# Patient Record
Sex: Male | Born: 1995 | Race: White | Hispanic: No | Marital: Single | Smoking: Current every day smoker
Health system: Southern US, Community
[De-identification: ages and names within clinical notes are randomized; demographics above are authoritative.]

## PROBLEM LIST (undated history)

## (undated) DIAGNOSIS — J45909 Unspecified asthma, uncomplicated: Secondary | ICD-10-CM

---
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
## 2014-12-29 ENCOUNTER — Ambulatory Visit: Payer: Self-pay | Admitting: Unknown Physician Specialty

## 2014-12-30 ENCOUNTER — Encounter: Payer: Self-pay | Admitting: Unknown Physician Specialty

## 2014-12-30 ENCOUNTER — Ambulatory Visit (INDEPENDENT_AMBULATORY_CARE_PROVIDER_SITE_OTHER): Payer: 59 | Admitting: Unknown Physician Specialty

## 2014-12-30 VITALS — BP 129/79 | HR 79 | Temp 97.6°F | Ht 70.1 in | Wt 151.6 lb

## 2014-12-30 DIAGNOSIS — J0110 Acute frontal sinusitis, unspecified: Secondary | ICD-10-CM

## 2014-12-30 DIAGNOSIS — R059 Cough, unspecified: Secondary | ICD-10-CM

## 2014-12-30 DIAGNOSIS — J4520 Mild intermittent asthma, uncomplicated: Secondary | ICD-10-CM | POA: Insufficient documentation

## 2014-12-30 DIAGNOSIS — J4521 Mild intermittent asthma with (acute) exacerbation: Secondary | ICD-10-CM | POA: Diagnosis not present

## 2014-12-30 DIAGNOSIS — R05 Cough: Secondary | ICD-10-CM | POA: Diagnosis not present

## 2014-12-30 MED ORDER — BENZONATATE 200 MG PO CAPS: 200.0000 mg | ORAL_CAPSULE | Freq: Two times a day (BID) | ORAL | Status: DC | PRN

## 2014-12-30 MED ORDER — AZITHROMYCIN 250 MG PO TABS: ORAL_TABLET | ORAL | Status: DC

## 2014-12-30 MED ORDER — ALBUTEROL SULFATE HFA 108 (90 BASE) MCG/ACT IN AERS: 2.0000 | INHALATION_SPRAY | Freq: Four times a day (QID) | RESPIRATORY_TRACT | Status: DC | PRN

## 2014-12-30 NOTE — Progress Notes (Signed)
BP 129/79 mmHg  Pulse 79  Temp(Src) 97.6 F (36.4 C)  Ht 5' 10.1" (1.781 m)  Wt 151 lb 9.6 oz (68.765 kg)  BMI 21.68 kg/m2  SpO2 98%   Subjective:    Patient ID: Jeremiah Mccarthy, male    DOB: 06/21/1995, 19 y.o.   MRN: [id number]  HPI: Jeremiah Mccarthy is a 19 y.o. male  Chief Complaint  Patient presents with  . Cough    pt states he has a cough, chest pain from coughing so much, and sometimes a sore throat. Pt states cough started about a week and a half ago.    Cough This is a new problem. The current episode started 1 to 4 weeks ago. The problem has been unchanged. The problem occurs constantly. The cough is non-productive. Associated symptoms include a sore throat and wheezing. Pertinent negatives include no chest pain, ear congestion, ear pain, fever or nasal congestion. The symptoms are aggravated by lying down. Treatments tried: nebulizer treatments, Nyquil, and Mucinex. The treatment provided no relief. His past medical history is significant for asthma.    Relevant past medical, surgical, family and social history reviewed and updated as indicated. Interim medical history since our last visit reviewed. Allergies and medications reviewed and updated.  Review of Systems  Constitutional: Negative for fever.  HENT: Positive for sore throat. Negative for ear pain.   Respiratory: Positive for cough and wheezing.   Cardiovascular: Negative for chest pain.    Per HPI unless specifically indicated above     Objective:    BP 129/79 mmHg  Pulse 79  Temp(Src) 97.6 F (36.4 C)  Ht 5' 10.1" (1.781 m)  Wt 151 lb 9.6 oz (68.765 kg)  BMI 21.68 kg/m2  SpO2 98%  Wt Readings from Last 3 Encounters:  12/30/14 151 lb 9.6 oz (68.765 kg)  07/11/12 141 lb (63.957 kg) (34 %*, Z = -0.41)   * Growth percentiles are based on CDC 2-20 Years data.    Physical Exam  Constitutional: He is oriented to person, place, and time. He appears well-developed and well-nourished. No  distress.  HENT:  Head: Normocephalic and atraumatic.  Right Ear: Tympanic membrane and ear canal normal.  Left Ear: Tympanic membrane and ear canal normal.  Nose: Mucosal edema and sinus tenderness present. Right sinus exhibits maxillary sinus tenderness. Left sinus exhibits maxillary sinus tenderness.  Mouth/Throat: Mucous membranes are normal. Oropharyngeal exudate and posterior oropharyngeal edema present.  Eyes: Conjunctivae and lids are normal. Right eye exhibits no discharge. Left eye exhibits no discharge. No scleral icterus.  Cardiovascular: Normal rate and regular rhythm.   Pulmonary/Chest: Effort normal. No respiratory distress.  Abdominal: Normal appearance and bowel sounds are normal. He exhibits no distension. There is no splenomegaly or hepatomegaly. There is no tenderness.  Musculoskeletal: Normal range of motion.  Neurological: He is alert and oriented to person, place, and time.  Skin: Skin is intact. No rash noted. No pallor.  Psychiatric: He has a normal mood and affect. His behavior is normal. Judgment and thought content normal.    No results found for this or any previous visit.    Assessment & Plan:   Problem List Items Addressed This Visit      Unprioritized   Asthma, mild intermittent    rx for prn Albuterol       Relevant Medications   albuterol (PROVENTIL HFA;VENTOLIN HFA) 108 (90 BASE) MCG/ACT inhaler    Other Visit Diagnoses    Acute frontal sinusitis,  recurrence not specified    -  Primary    Rx for Z pacj    Relevant Medications    azithromycin (ZITHROMAX Z-PAK) 250 MG tablet    benzonatate (TESSALON) 200 MG capsule    Cough        rx for Tessalon perles        Follow up plan: Return if symptoms worsen or fail to improve.

## 2014-12-30 NOTE — Assessment & Plan Note (Signed)
rx for prn Albuterol

## 2019-10-17 ENCOUNTER — Ambulatory Visit: Payer: Self-pay

## 2019-10-20 ENCOUNTER — Other Ambulatory Visit: Payer: Self-pay

## 2019-10-20 ENCOUNTER — Ambulatory Visit
Admission: EM | Admit: 2019-10-20 | Discharge: 2019-10-20 | Disposition: A | Discharge status: Home / Self Care | Payer: BC Managed Care – PPO | Attending: Emergency Medicine | Admitting: Emergency Medicine

## 2019-10-20 DIAGNOSIS — Z20822 Contact with and (suspected) exposure to covid-19: Secondary | ICD-10-CM

## 2019-10-20 NOTE — ED Triage Notes (Signed)
Pt is here with a COVID exposure, pt is denying ALL sxs today. 

## 2019-10-23 LAB — SARS-COV-2, NAA 2 DAY TAT

## 2019-10-23 LAB — NOVEL CORONAVIRUS, NAA: SARS-CoV-2, NAA: NOT DETECTED

## 2020-02-20 ENCOUNTER — Encounter: Payer: Self-pay | Admitting: Emergency Medicine

## 2020-02-20 ENCOUNTER — Other Ambulatory Visit: Payer: Self-pay

## 2020-02-20 DIAGNOSIS — M545 Low back pain, unspecified: Secondary | ICD-10-CM | POA: Insufficient documentation

## 2020-02-20 DIAGNOSIS — J4520 Mild intermittent asthma, uncomplicated: Secondary | ICD-10-CM | POA: Diagnosis not present

## 2020-02-20 DIAGNOSIS — Y92410 Unspecified street and highway as the place of occurrence of the external cause: Secondary | ICD-10-CM | POA: Diagnosis not present
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
## 2020-02-21 ENCOUNTER — Emergency Department
Admission: EM | Admit: 2020-02-21 | Discharge: 2020-02-21 | Disposition: A | Discharge status: Home / Self Care | Payer: BC Managed Care – PPO | Attending: Emergency Medicine | Admitting: Emergency Medicine

## 2020-02-21 DIAGNOSIS — M7918 Myalgia, other site: Secondary | ICD-10-CM

## 2020-02-21 NOTE — Discharge Instructions (Signed)
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
## 2020-03-28 ENCOUNTER — Emergency Department: Payer: No Typology Code available for payment source

## 2020-03-28 ENCOUNTER — Other Ambulatory Visit: Payer: Self-pay

## 2020-03-28 ENCOUNTER — Emergency Department
Admission: EM | Admit: 2020-03-28 | Discharge: 2020-03-28 | Disposition: A | Discharge status: Home / Self Care | Payer: No Typology Code available for payment source | Attending: Emergency Medicine | Admitting: Emergency Medicine

## 2020-03-28 DIAGNOSIS — S62322A Displaced fracture of shaft of third metacarpal bone, right hand, initial encounter for closed fracture: Secondary | ICD-10-CM | POA: Insufficient documentation

## 2020-03-28 DIAGNOSIS — Y92410 Unspecified street and highway as the place of occurrence of the external cause: Secondary | ICD-10-CM | POA: Insufficient documentation

## 2020-03-28 DIAGNOSIS — M545 Low back pain, unspecified: Secondary | ICD-10-CM | POA: Diagnosis not present

## 2020-03-28 DIAGNOSIS — S6991XA Unspecified injury of right wrist, hand and finger(s), initial encounter: Secondary | ICD-10-CM | POA: Diagnosis present

## 2020-03-28 DIAGNOSIS — J4520 Mild intermittent asthma, uncomplicated: Secondary | ICD-10-CM | POA: Diagnosis not present

## 2020-03-28 DIAGNOSIS — T07XXXA Unspecified multiple injuries, initial encounter: Secondary | ICD-10-CM

## 2020-03-28 DIAGNOSIS — F17200 Nicotine dependence, unspecified, uncomplicated: Secondary | ICD-10-CM | POA: Diagnosis not present

## 2020-03-28 DIAGNOSIS — S62324A Displaced fracture of shaft of fourth metacarpal bone, right hand, initial encounter for closed fracture: Secondary | ICD-10-CM | POA: Diagnosis not present

## 2020-03-28 MED ORDER — OXYCODONE-ACETAMINOPHEN 5-325 MG PO TABS
2.0000 | ORAL_TABLET | Freq: Once | ORAL | Status: AC
  Administered 2020-03-28: 2 via ORAL
  Filled 2020-03-28: qty 2

## 2020-03-28 MED ORDER — ONDANSETRON 4 MG PO TBDP
4.0000 mg | ORAL_TABLET | Freq: Once | ORAL | Status: AC
  Administered 2020-03-28: 4 mg via ORAL
  Filled 2020-03-28: qty 1

## 2020-03-28 MED ORDER — BACITRACIN ZINC 500 UNIT/GM EX OINT: TOPICAL_OINTMENT | Freq: Two times a day (BID) | CUTANEOUS | Status: DC

## 2020-03-28 MED ORDER — OXYCODONE-ACETAMINOPHEN 5-325 MG PO TABS: 2.0000 | ORAL_TABLET | Freq: Four times a day (QID) | ORAL | 0 refills | Status: DC | PRN

## 2020-03-28 MED ORDER — DOCUSATE SODIUM 100 MG PO CAPS: ORAL_CAPSULE | ORAL | 0 refills | Status: DC
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
## 2020-03-28 NOTE — ED Notes (Signed)
Patient transported to X-ray 
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
## 2020-03-29 ENCOUNTER — Other Ambulatory Visit: Payer: Self-pay | Admitting: Orthopedic Surgery

## 2020-03-30 ENCOUNTER — Other Ambulatory Visit
Admission: RE | Admit: 2020-03-30 | Discharge: 2020-03-30 | Disposition: A | Discharge status: Home / Self Care | Payer: HRSA Program | Source: Ambulatory Visit | Attending: Orthopedic Surgery | Admitting: Orthopedic Surgery

## 2020-03-30 ENCOUNTER — Other Ambulatory Visit: Payer: Self-pay

## 2020-03-30 DIAGNOSIS — Z01812 Encounter for preprocedural laboratory examination: Secondary | ICD-10-CM | POA: Diagnosis present

## 2020-03-30 DIAGNOSIS — Z20822 Contact with and (suspected) exposure to covid-19: Secondary | ICD-10-CM | POA: Diagnosis not present

## 2020-03-30 LAB — SARS CORONAVIRUS 2 (TAT 6-24 HRS): SARS Coronavirus 2: NEGATIVE

## 2020-03-31 ENCOUNTER — Other Ambulatory Visit
Admission: RE | Admit: 2020-03-31 | Discharge: 2020-03-31 | Disposition: A | Discharge status: Home / Self Care | Payer: Self-pay | Source: Ambulatory Visit | Attending: Orthopedic Surgery | Admitting: Orthopedic Surgery

## 2020-03-31 ENCOUNTER — Other Ambulatory Visit: Payer: Self-pay

## 2020-03-31 HISTORY — DX: Unspecified asthma, uncomplicated: J45.909

## 2020-03-31 NOTE — Patient Instructions (Signed)
Your procedure is scheduled on: Thursday April 01, 2020. Report to Day Surgery inside [facility] 2nd floor (stop by Admissions desk before getting on the Elevator). To find out your arrival time please call [phone] between 1PM - 3PM on Wednesday March 31, 2020.  Remember: Instructions that are not followed completely may result in serious medical risk,  up to and including death, or upon the discretion of your surgeon and anesthesiologist your  surgery may need to be rescheduled.     _X__ 1. Do not eat food after midnight the night before your procedure.                 No chewing gum or hard candies. You may drink clear liquids up to 2 hours                 before you are scheduled to arrive for your surgery- DO not drink clear                 liquids within 2 hours of the start of your surgery.                 Clear Liquids include:  water, apple juice without pulp, clear Gatorade, G2 or                  Gatorade Zero (avoid Red/Purple/Blue), Black Coffee or Tea (Do not add                 anything to coffee or tea).  __X__2.  On the morning of surgery brush your teeth with toothpaste and water, you                may rinse your mouth with mouthwash if you wish.  Do not swallow any toothpaste of mouthwash.     _X__ 3.  No Alcohol for 24 hours before or after surgery.   _X__ 4.  Do Not Smoke or use e-cigarettes For 24 Hours Prior to Your Surgery.                 Do not use any chewable tobacco products for at least 6 hours prior to                 Surgery.  _X__  5.  Do not use any recreational drugs (marijuana, cocaine, heroin, ecstasy, MDMA or other)                For at least one week prior to your surgery.  Combination of these drugs with anesthesia                May have life threatening results.  __X__ 6.  Notify your doctor if there is any change in your medical condition      (cold, fever, infections).     Do not wear jewelry, make-up,  hairpins, clips or nail polish. Do not wear lotions, powders, or perfumes. You may wear deodorant. Do not shave 48 hours prior to surgery. Men may shave face and neck. Do not bring valuables to the hospital.    [facility] is not responsible for any belongings or valuables.  Contacts, dentures or bridgework may not be worn into surgery. Leave your suitcase in the car. After surgery it may be brought to your room. For patients admitted to the hospital, discharge time is determined by your treatment team.   Patients discharged the day of surgery will not be allowed to drive  home.   Make arrangements for someone to be with you for the first 24 hours of your Same Day Discharge.   __X__ Take these medicines the morning of surgery with A SIP OF WATER:    1. oxyCODONE-acetaminophen (PERCOCET) 5-325      ____ Fleet Enema (as directed)   __X__ Use Antibacterial Soap as directed  ____ Use Benzoyl Peroxide Gel as instructed  ____ Use inhalers on the day of surgery  ____ Stop metformin 2 days prior to surgery    __x__ Stop Anti-inflammatories such as Ibuprofen, Aleve, Advil, naproxen, aspirin and or BC powders.   __x__ Stop supplements until after surgery.    __x__ Do not start any herbal supplements before your procedure.    If you have any questions regarding your pre-procedure instructions,  Please call Pre-admit Testing at [phone].

## 2020-04-01 ENCOUNTER — Ambulatory Visit
Admission: RE | Admit: 2020-04-01 | Discharge: 2020-04-01 | Disposition: A | Discharge status: Home / Self Care | Payer: Self-pay | Attending: Orthopedic Surgery | Admitting: Orthopedic Surgery

## 2020-04-01 ENCOUNTER — Other Ambulatory Visit: Payer: Self-pay

## 2020-04-01 ENCOUNTER — Ambulatory Visit: Payer: Self-pay

## 2020-04-01 ENCOUNTER — Encounter: Payer: Self-pay | Admitting: Orthopedic Surgery

## 2020-04-01 ENCOUNTER — Ambulatory Visit: Payer: Self-pay | Admitting: Certified Registered"

## 2020-04-01 ENCOUNTER — Encounter
Admission: RE | Disposition: A | Discharge status: Home / Self Care | Payer: Self-pay | Source: Home / Self Care | Attending: Orthopedic Surgery

## 2020-04-01 DIAGNOSIS — Z8781 Personal history of (healed) traumatic fracture: Secondary | ICD-10-CM

## 2020-04-01 DIAGNOSIS — S62325A Displaced fracture of shaft of fourth metacarpal bone, left hand, initial encounter for closed fracture: Secondary | ICD-10-CM | POA: Diagnosis present

## 2020-04-01 DIAGNOSIS — F17200 Nicotine dependence, unspecified, uncomplicated: Secondary | ICD-10-CM | POA: Diagnosis not present

## 2020-04-01 DIAGNOSIS — S62322A Displaced fracture of shaft of third metacarpal bone, right hand, initial encounter for closed fracture: Secondary | ICD-10-CM | POA: Insufficient documentation

## 2020-04-01 HISTORY — PX: OPEN REDUCTION INTERNAL FIXATION (ORIF) METACARPAL: SHX6234

## 2020-04-01 SURGERY — OPEN REDUCTION INTERNAL FIXATION (ORIF) METACARPAL
Anesthesia: General | Site: Finger | Laterality: Bilateral

## 2020-04-01 MED ORDER — MIDAZOLAM HCL 2 MG/2ML IJ SOLN
INTRAMUSCULAR | Status: AC
  Filled 2020-04-01: qty 2

## 2020-04-01 MED ORDER — METOCLOPRAMIDE HCL 10 MG PO TABS: 5.0000 mg | ORAL_TABLET | Freq: Three times a day (TID) | ORAL | Status: DC | PRN

## 2020-04-01 MED ORDER — ONDANSETRON HCL 4 MG/2ML IJ SOLN
INTRAMUSCULAR | Status: AC
  Filled 2020-04-01: qty 2

## 2020-04-01 MED ORDER — FENTANYL CITRATE (PF) 100 MCG/2ML IJ SOLN
INTRAMUSCULAR | Status: AC
  Filled 2020-04-01: qty 2

## 2020-04-01 MED ORDER — ACETAMINOPHEN 10 MG/ML IV SOLN
INTRAVENOUS | Status: AC
  Filled 2020-04-01: qty 100

## 2020-04-01 MED ORDER — ONDANSETRON HCL 4 MG PO TABS: 4.0000 mg | ORAL_TABLET | Freq: Four times a day (QID) | ORAL | Status: DC | PRN

## 2020-04-01 MED ORDER — ACETAMINOPHEN 10 MG/ML IV SOLN
INTRAVENOUS | Status: DC | PRN
  Administered 2020-04-01: 1000 mg via INTRAVENOUS

## 2020-04-01 MED ORDER — DEXAMETHASONE SODIUM PHOSPHATE 10 MG/ML IJ SOLN
INTRAMUSCULAR | Status: DC | PRN
  Administered 2020-04-01: 10 mg via INTRAVENOUS

## 2020-04-01 MED ORDER — LACTATED RINGERS IV SOLN: INTRAVENOUS | Status: DC

## 2020-04-01 MED ORDER — FENTANYL CITRATE (PF) 100 MCG/2ML IJ SOLN
25.0000 ug | INTRAMUSCULAR | Status: DC | PRN
Start: 2020-04-01 — End: 2020-04-01

## 2020-04-01 MED ORDER — DEXAMETHASONE SODIUM PHOSPHATE 10 MG/ML IJ SOLN
INTRAMUSCULAR | Status: AC
  Filled 2020-04-01: qty 1

## 2020-04-01 MED ORDER — GLYCOPYRROLATE 0.2 MG/ML IJ SOLN
INTRAMUSCULAR | Status: DC | PRN
  Administered 2020-04-01: .2 mg via INTRAVENOUS

## 2020-04-01 MED ORDER — PROPOFOL 10 MG/ML IV BOLUS
INTRAVENOUS | Status: DC | PRN
  Administered 2020-04-01: 180 mg via INTRAVENOUS

## 2020-04-01 MED ORDER — METOCLOPRAMIDE HCL 5 MG/ML IJ SOLN: 5.0000 mg | Freq: Three times a day (TID) | INTRAMUSCULAR | Status: DC | PRN

## 2020-04-01 MED ORDER — DEXMEDETOMIDINE (PRECEDEX) IN NS 20 MCG/5ML (4 MCG/ML) IV SYRINGE
PREFILLED_SYRINGE | INTRAVENOUS | Status: DC | PRN
  Administered 2020-04-01: 8 ug via INTRAVENOUS
  Administered 2020-04-01: 12 ug via INTRAVENOUS

## 2020-04-01 MED ORDER — LIDOCAINE HCL (CARDIAC) PF 100 MG/5ML IV SOSY
PREFILLED_SYRINGE | INTRAVENOUS | Status: DC | PRN
  Administered 2020-04-01: 60 mg via INTRAVENOUS

## 2020-04-01 MED ORDER — FAMOTIDINE 20 MG PO TABS
ORAL_TABLET | ORAL | Status: AC
  Administered 2020-04-01: 20 mg via ORAL
  Filled 2020-04-01: qty 1

## 2020-04-01 MED ORDER — CHLORHEXIDINE GLUCONATE 0.12 % MT SOLN: 15.0000 mL | Freq: Once | OROMUCOSAL | Status: AC

## 2020-04-01 MED ORDER — CHLORHEXIDINE GLUCONATE 0.12 % MT SOLN
OROMUCOSAL | Status: AC
  Administered 2020-04-01: 15 mL via OROMUCOSAL
  Filled 2020-04-01: qty 15

## 2020-04-01 MED ORDER — EPHEDRINE 5 MG/ML INJ
INTRAVENOUS | Status: AC
  Filled 2020-04-01: qty 10

## 2020-04-01 MED ORDER — KETOROLAC TROMETHAMINE 30 MG/ML IJ SOLN
INTRAMUSCULAR | Status: DC | PRN
  Administered 2020-04-01: 30 mg via INTRAVENOUS

## 2020-04-01 MED ORDER — FAMOTIDINE 20 MG PO TABS: 20.0000 mg | ORAL_TABLET | Freq: Once | ORAL | Status: AC

## 2020-04-01 MED ORDER — ONDANSETRON HCL 4 MG/2ML IJ SOLN
INTRAMUSCULAR | Status: DC | PRN
  Administered 2020-04-01: 4 mg via INTRAVENOUS

## 2020-04-01 MED ORDER — ONDANSETRON HCL 4 MG/2ML IJ SOLN: 4.0000 mg | Freq: Four times a day (QID) | INTRAMUSCULAR | Status: DC | PRN

## 2020-04-01 MED ORDER — ONDANSETRON HCL 4 MG/2ML IJ SOLN: 4.0000 mg | Freq: Once | INTRAMUSCULAR | Status: DC | PRN

## 2020-04-01 MED ORDER — PHENYLEPHRINE HCL (PRESSORS) 10 MG/ML IV SOLN
INTRAVENOUS | Status: DC | PRN
  Administered 2020-04-01: 100 ug via INTRAVENOUS
  Administered 2020-04-01: 50 ug via INTRAVENOUS

## 2020-04-01 MED ORDER — FENTANYL CITRATE (PF) 100 MCG/2ML IJ SOLN
INTRAMUSCULAR | Status: DC | PRN
  Administered 2020-04-01: 100 ug via INTRAVENOUS
  Administered 2020-04-01: 25 ug via INTRAVENOUS
  Administered 2020-04-01: 50 ug via INTRAVENOUS
  Administered 2020-04-01: 25 ug via INTRAVENOUS

## 2020-04-01 MED ORDER — CEFAZOLIN SODIUM-DEXTROSE 2-4 GM/100ML-% IV SOLN
2.0000 g | INTRAVENOUS | Status: AC
  Administered 2020-04-01: 2 g via INTRAVENOUS

## 2020-04-01 MED ORDER — SODIUM CHLORIDE 0.9 % IV SOLN: INTRAVENOUS | Status: DC

## 2020-04-01 MED ORDER — CEFAZOLIN SODIUM-DEXTROSE 2-4 GM/100ML-% IV SOLN
INTRAVENOUS | Status: AC
  Filled 2020-04-01: qty 100

## 2020-04-01 MED ORDER — EPHEDRINE SULFATE 50 MG/ML IJ SOLN
INTRAMUSCULAR | Status: DC | PRN
  Administered 2020-04-01: 5 mg via INTRAVENOUS

## 2020-04-01 MED ORDER — LIDOCAINE HCL (PF) 2 % IJ SOLN
INTRAMUSCULAR | Status: AC
  Filled 2020-04-01: qty 5

## 2020-04-01 MED ORDER — ORAL CARE MOUTH RINSE: 15.0000 mL | Freq: Once | OROMUCOSAL | Status: AC

## 2020-04-01 MED ORDER — MIDAZOLAM HCL 2 MG/2ML IJ SOLN
INTRAMUSCULAR | Status: DC | PRN
  Administered 2020-04-01: 2 mg via INTRAVENOUS

## 2020-04-01 MED ORDER — PROPOFOL 10 MG/ML IV BOLUS
INTRAVENOUS | Status: AC
  Filled 2020-04-01: qty 40

## 2020-04-01 SURGICAL SUPPLY — 50 items
APL PRP STRL LF DISP 70% ISPRP (MISCELLANEOUS) ×1
BIT DRILL 1.1X60MM (BIT) ×1 IMPLANT
BNDG CMPR STD VLCR NS LF 5.8X4 (GAUZE/BANDAGES/DRESSINGS) ×1
BNDG ELASTIC 4X5.8 VLCR NS LF (GAUZE/BANDAGES/DRESSINGS) ×2 IMPLANT
BNDG GAUZE 1X2.1 STRL (MISCELLANEOUS) ×2 IMPLANT
CHLORAPREP W/TINT 26 (MISCELLANEOUS) ×2 IMPLANT
COVER WAND RF STERILE (DRAPES) ×2 IMPLANT
CUFF TOURN SGL QUICK 12 (TOURNIQUET CUFF) ×2 IMPLANT
CUFF TOURN SGL QUICK 18X4 (TOURNIQUET CUFF) IMPLANT
DRAPE FLUOR MINI C-ARM 54X84 (DRAPES) ×2 IMPLANT
DRILL 1.1X60MM (BIT) ×2
DRILL BIT 2.5MM MINI-QUICK CON (BIT) ×2 IMPLANT
DRIVER BIT 1.5 (TRAUMA) ×2 IMPLANT
ELECT CAUTERY BLADE 6.4 (BLADE) ×2 IMPLANT
ELECT REM PT RETURN 9FT ADLT (ELECTROSURGICAL) ×2
ELECTRODE REM PT RTRN 9FT ADLT (ELECTROSURGICAL) ×1 IMPLANT
GAUZE SPONGE 4X4 12PLY STRL (GAUZE/BANDAGES/DRESSINGS) ×2 IMPLANT
GAUZE XEROFORM 1X8 LF (GAUZE/BANDAGES/DRESSINGS) ×2 IMPLANT
GLOVE SURG SYN 9.0  PF PI (GLOVE) ×1
GLOVE SURG SYN 9.0 PF PI (GLOVE) ×1 IMPLANT
GOWN SRG 2XL LVL 4 RGLN SLV (GOWNS) ×1 IMPLANT
GOWN STRL NON-REIN 2XL LVL4 (GOWNS) ×2
GOWN STRL REUS W/ TWL LRG LVL3 (GOWN DISPOSABLE) ×1 IMPLANT
GOWN STRL REUS W/TWL LRG LVL3 (GOWN DISPOSABLE) ×2
K-WIRE .045X6 DBL TRO NS (WIRE) ×2
K-WIRE DBL TRONS .035X6 (WIRE) ×2
KIT TURNOVER KIT A (KITS) ×2 IMPLANT
KWIRE .045X6 DBL TRO NS (WIRE) ×1 IMPLANT
KWIRE DBL TRONS .035X6 (WIRE) ×1 IMPLANT
MANIFOLD NEPTUNE II (INSTRUMENTS) ×2 IMPLANT
NEEDLE FILTER BLUNT 18X 1/2SAF (NEEDLE) ×1
NEEDLE FILTER BLUNT 18X1 1/2 (NEEDLE) ×1 IMPLANT
NS IRRIG 500ML POUR BTL (IV SOLUTION) ×2 IMPLANT
PACK EXTREMITY ARMC (MISCELLANEOUS) ×2 IMPLANT
PAD PREP 24X41 OB/GYN DISP (PERSONAL CARE ITEMS) ×2 IMPLANT
PADDING CAST BLEND 4X4 NS (MISCELLANEOUS) ×2 IMPLANT
PLATE STRAIGHT LOCK 1.5 (Plate) ×4 IMPLANT
SCALPEL PROTECTED #15 DISP (BLADE) ×4 IMPLANT
SCREW 1.5X8 (Screw) ×2 IMPLANT
SCREW CORTICAL 1.5X9MM WRIST (Screw) ×2 IMPLANT
SCREW L 1.5X14 (Screw) ×2 IMPLANT
SCREW LOCKING 1.5X13MM (Screw) ×2 IMPLANT
SCREW NL 1.5X11 WRIST (Screw) ×6 IMPLANT
SCREW NL 1.5X12 (Screw) ×10 IMPLANT
SCREW NL 1.5X13 (Screw) ×2 IMPLANT
SCREW NONIOC 1.5 10M (Screw) ×2 IMPLANT
SCREW NONIOC 1.5 14M (Screw) ×2 IMPLANT
SCREW NONIOC 1.5 16M (Screw) ×2 IMPLANT
SUT ETHIBOND 4-0 (SUTURE) ×2 IMPLANT
SUT ETHILON 5 0 CL P 3 (SUTURE) ×2 IMPLANT

## 2020-04-01 NOTE — Discharge Instructions (Addendum)
AMBULATORY SURGERY  DISCHARGE INSTRUCTIONS   1) The drugs that you were given will stay in your system until tomorrow so for the next 24 hours you should not:  A) Drive an automobile B) Make any legal decisions C) Drink any alcoholic beverage   2) You may resume regular meals tomorrow.  Today it is better to start with liquids and gradually work up to solid foods.  You may eat anything you prefer, but it is better to start with liquids, then soup and crackers, and gradually work up to solid foods.   3) Please notify your doctor immediately if you have any unusual bleeding, trouble breathing, redness and pain at the surgery site, drainage, fever, or pain not relieved by medication.    4) Additional Instructions:        Please contact your physician with any problems or Same Day Surgery at [phone], Monday through Friday 6 am to 4 pm, or [place] [facility] number at [phone].    Keep arm elevated is much as you can with ice to the back of the hand today and tomorrow. Pain medicine as directed. Wiggle fingers in splint but do not try to straighten wrist or fingers. Call office if having problems No driving for 6 weeks

## 2020-04-01 NOTE — Anesthesia Postprocedure Evaluation (Signed)
Anesthesia Post Note  Patient: Jeremiah Mccarthy  Procedure(s) Performed: ORIF 3rd metacarpal, right ORIF 4th metacarpal, left (Bilateral Finger)  Patient location during evaluation: PACU Anesthesia Type: General Level of consciousness: awake and alert Pain management: pain level controlled Vital Signs Assessment: post-procedure vital signs reviewed and stable Respiratory status: spontaneous breathing, nonlabored ventilation, respiratory function stable and patient connected to nasal cannula oxygen Cardiovascular status: blood pressure returned to baseline and stable Postop Assessment: no apparent nausea or vomiting Anesthetic complications: no   No complications documented.   Last Vitals:  Vitals:   04/01/20 1742 04/01/20 1745  BP:  106/72  Pulse: (!) 54 (!) 54  Resp: 11 17  Temp: (!) 36.1 C (!) 36.1 C  SpO2: 98% 100%    Last Pain:  Vitals:   04/01/20 1745  TempSrc: Temporal  PainSc: 0-No pain                 Cleda Mccreedy Lindsey Hommel

## 2020-04-01 NOTE — Anesthesia Preprocedure Evaluation (Signed)
Anesthesia Evaluation  Patient identified by MRN, date of birth, ID band Patient awake    Reviewed: Allergy & Precautions, NPO status , Patient's Chart, lab work & pertinent test results  Airway Mallampati: II  TM Distance: >3 FB     Dental  (+) Teeth Intact   Pulmonary asthma , Current Smoker,    Pulmonary exam normal        Cardiovascular negative cardio ROS Normal cardiovascular exam     Neuro/Psych negative neurological ROS  negative psych ROS   GI/Hepatic negative GI ROS, Neg liver ROS,   Endo/Other  negative endocrine ROS  Renal/GU negative Renal ROS  negative genitourinary   Musculoskeletal   Abdominal Normal abdominal exam  (+)   Peds negative pediatric ROS (+)  Hematology negative hematology ROS (+)   Anesthesia Other Findings Past Medical History: No date: Asthma  Reproductive/Obstetrics                             Anesthesia Physical Anesthesia Plan  ASA: II  Anesthesia Plan: General   Post-op Pain Management:    Induction: Intravenous  PONV Risk Score and Plan:   Airway Management Planned: LMA  Additional Equipment:   Intra-op Plan:   Post-operative Plan: Extubation in OR  Informed Consent: I have reviewed the patients History and Physical, chart, labs and discussed the procedure including the risks, benefits and alternatives for the proposed anesthesia with the patient or authorized representative who has indicated his/her understanding and acceptance.     Dental advisory given  Plan Discussed with: CRNA and Surgeon  Anesthesia Plan Comments:         Anesthesia Quick Evaluation

## 2020-04-01 NOTE — Anesthesia Procedure Notes (Signed)
Procedure Name: LMA Insertion Date/Time: 04/01/2020 2:46 PM Performed by: Jaye Beagle, CRNA Pre-anesthesia Checklist: Patient identified, Emergency Drugs available, Suction available and Patient being monitored Patient Re-evaluated:Patient Re-evaluated prior to induction Oxygen Delivery Method: Circle system utilized Preoxygenation: Pre-oxygenation with 100% oxygen Induction Type: IV induction Ventilation: Mask ventilation without difficulty LMA: LMA inserted LMA Size: 4.0 Number of attempts: 1 Tube secured with: Tape Dental Injury: Teeth and Oropharynx as per pre-operative assessment
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
## 2020-04-01 NOTE — Op Note (Signed)
04/01/2020  4:51 PM  PATIENT:  Jeremiah Mccarthy  25 y.o. male  PRE-OPERATIVE DIAGNOSIS:  Closed displaced fracture of shaft of third metacarpal bone of right hand, initial encounter S62.322A Closed displaced fracture of shaft of fourth metacarpal bone of left hand, initial encounter S62.325A  POST-OPERATIVE DIAGNOSIS:  Closed displaced fracture of shaft of third metacarpal bone of right hand, initial encounter S62.322A Closed displaced fracture of shaft of fourth metacarpal bone of left hand, initial encounter S62.325A  PROCEDURE:  Procedure(s): ORIF 3rd metacarpal, right ORIF 4th metacarpal, left (Bilateral)  SURGEON: Leitha Schuller, MD  ASSISTANTS: None  ANESTHESIA:   general  EBL:  Total I/O In: 1000 [I.V.:1000] Out: 3 [Blood:3]  BLOOD ADMINISTERED:none  DRAINS: none   LOCAL MEDICATIONS USED:  MARCAINE     SPECIMEN:  No Specimen  DISPOSITION OF SPECIMEN:  N/A  COUNTS:  YES  TOURNIQUET:   Total Tourniquet Time Documented: Upper Arm (Right) - 91 minutes Total: Upper Arm (Right) - 91 minutes   IMPLANTS: [employer] hand system with 1.5 mm plates and screws  DICTATION: .Dragon Dictation patient was brought to the operating room and after adequate general anesthesia was obtained the right arm was prepped and draped you sterile fashion with tourniquet applied the upper arm.  After patient identification and timeout procedure tourniquet was raised and incision made between the third and fourth metacarpals with the juncturae split and the metacarpal fractures easily identified as they had separated from the adjacent tissue.  The third metacarpal was addressed first getting exposure and getting traction applied and a K wire to hold it provisionally 2 screws were placed from the side to try to get compression at the split it was going up into the head and then a plate was applied dorsally with multiple screws holding the fracture in anatomic alignment based on mini C arm  views.  Rotation appeared correct on hand flexion.  Went after this was complete with acceptable alignment the fourth metacarpal was exposed in a similar fashion and a shorter plate applied as it was partially reduced by fixing the third which had more significant injury.  After the 6-hole plate was applied to the fourth metacarpal multiple screws were placed with good fixation and stable to rotation flexion and extension rotation appeared stable the results some ulnar drift of the third MCP joint.  The wound was irrigated and tourniquet let down after dressing applied with 3-0 Vicryl used subcutaneously and 4-0 nylon for the skin in a running subcuticular closure 10 cc of half percent Sensorcaine were infiltrated as local followed by Xeroform 4 x 4's web roll and a dorsal block splint keeping the MCPs in flexion.  PLAN OF CARE: Discharge to home after PACU  PATIENT DISPOSITION:  PACU - hemodynamically stable.

## 2020-04-01 NOTE — Transfer of Care (Signed)
Immediate Anesthesia Transfer of Care Note  Patient: Jeremiah Mccarthy  Procedure(s) Performed: ORIF 3rd metacarpal, right ORIF 4th metacarpal, left (Bilateral Finger)  Patient Location: PACU  Anesthesia Type:General  Level of Consciousness: drowsy  Airway & Oxygen Therapy: Patient Spontanous Breathing and Patient connected to face mask oxygen  Post-op Assessment: Report given to RN  Post vital signs: stable  Last Vitals:  Vitals Value Taken Time  BP    Temp    Pulse 56 04/01/20 1645  Resp    SpO2 100 % 04/01/20 1645  Vitals shown include unvalidated device data.  Last Pain:  Vitals:   04/01/20 1313  TempSrc: Oral  PainSc: 0-No pain         Complications: No complications documented.

## 2020-04-02 ENCOUNTER — Encounter: Payer: Self-pay | Admitting: Orthopedic Surgery

## 2021-04-21 ENCOUNTER — Ambulatory Visit: Payer: Self-pay | Admitting: Internal Medicine

## 2022-10-23 IMAGING — DX DG HAND 2V*R*
2 series · 2 of 2 positions shown · non-contrast
Comparison: 03/28/2020

CLINICAL DATA: ORIF hand fracture

EXAM:
RIGHT HAND - 2 VIEW

[hand ap]
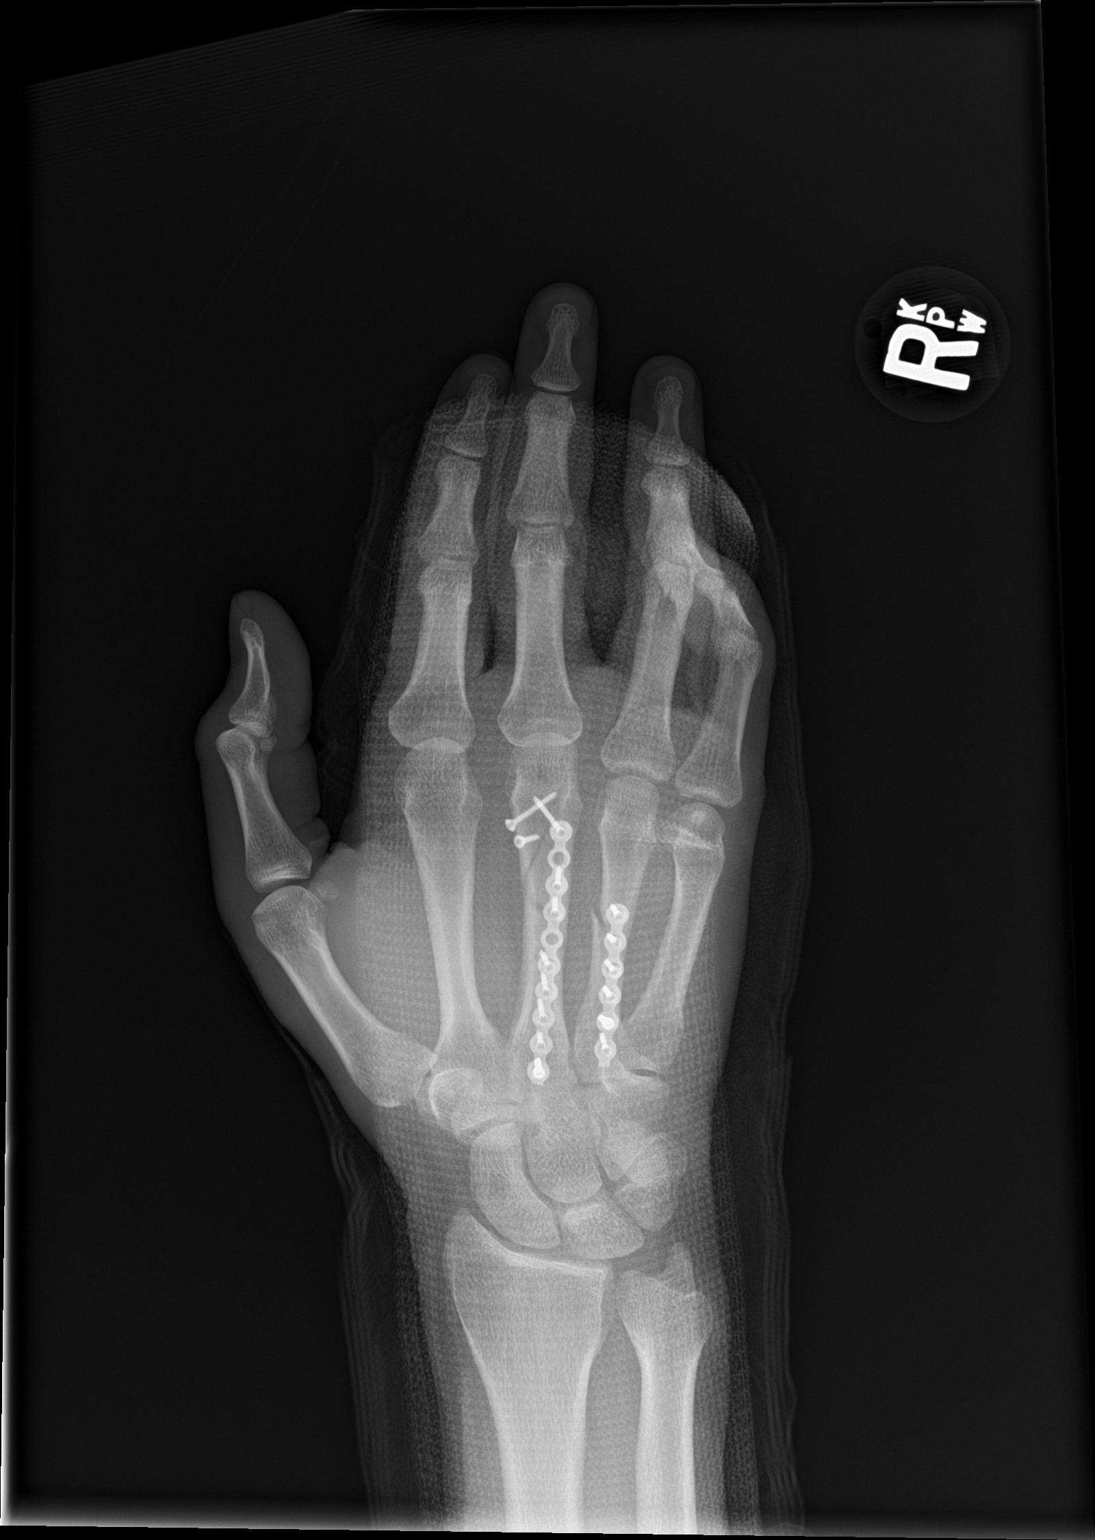

[hand lat]
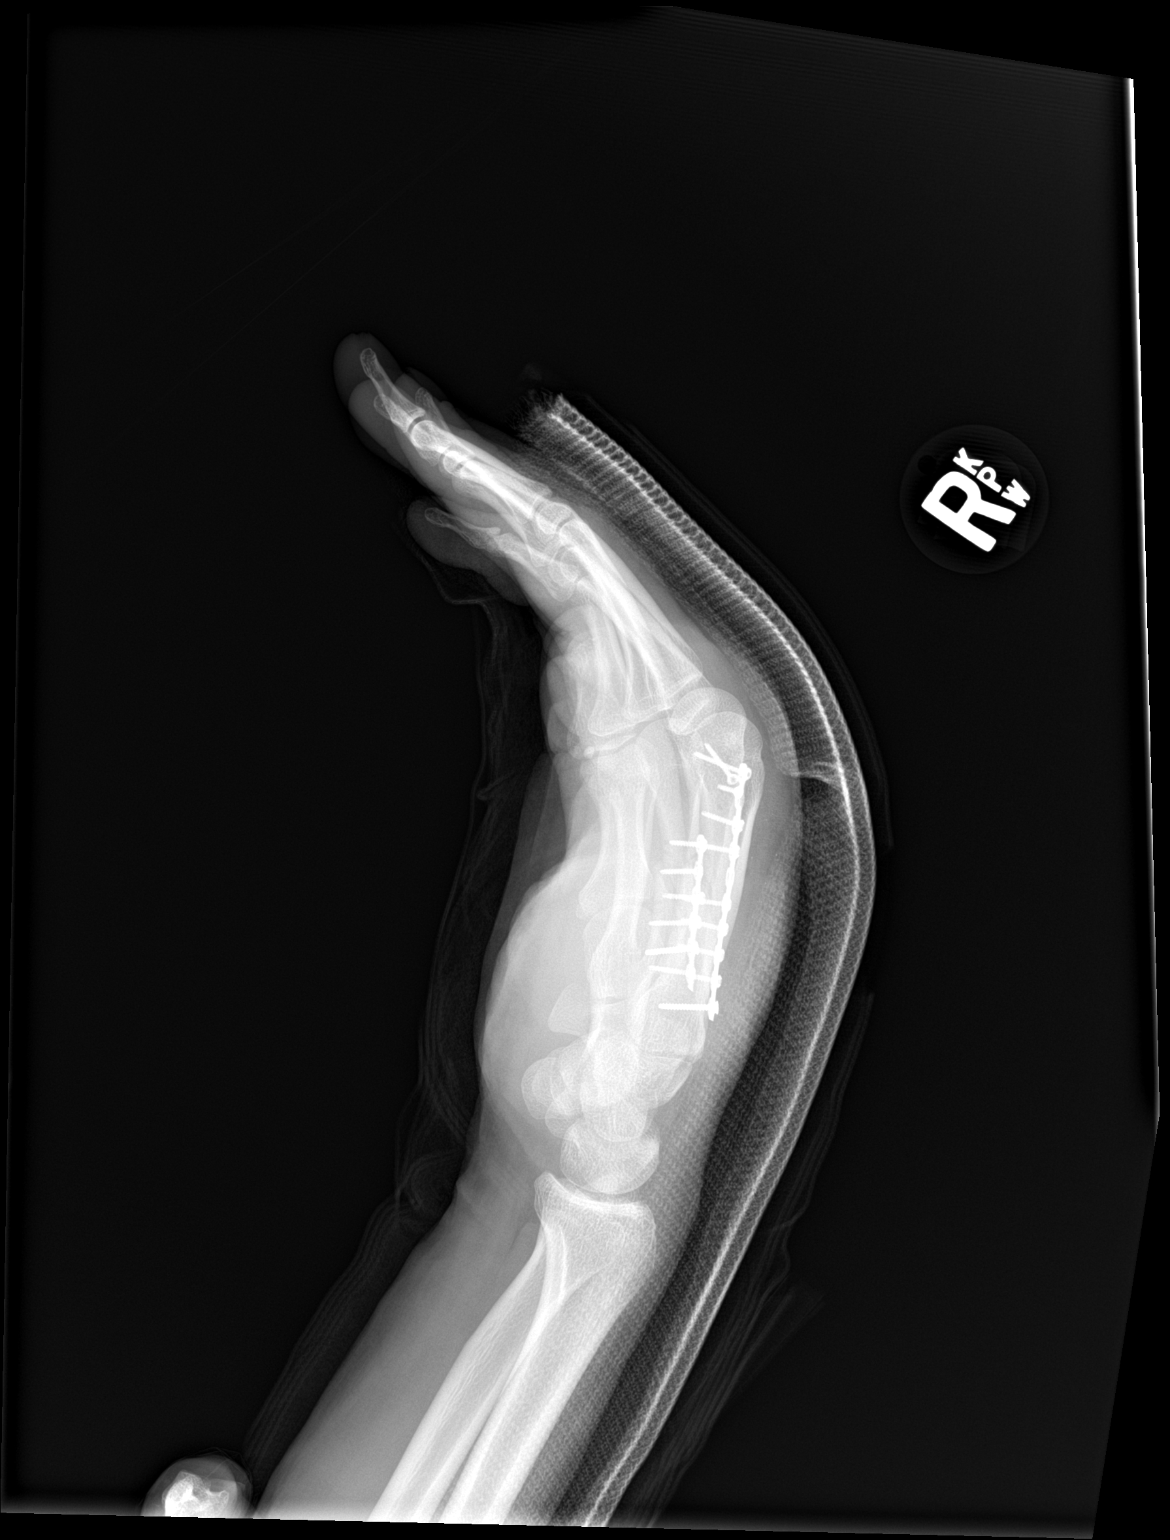

[2 of 2 positions shown; findings below may reference images not displayed]

FINDINGS: Plate and screw fixation of fractures of the third and fourth
metacarpals. Satisfactory fracture and hardware alignment. Dorsal
splint.
IMPRESSION: Satisfactory ORIF third and fourth metacarpal fractures.
Satisfactory alignment.

## 2023-02-08 ENCOUNTER — Telehealth: Payer: Self-pay

## 2023-02-08 ENCOUNTER — Encounter: Payer: Self-pay | Admitting: Nurse Practitioner

## 2023-02-08 NOTE — Telephone Encounter (Signed)
 I left a voicemail for patient asking him to please call us.  I also sent a letter to patient via MyChart.  When patient calls back, please reschedule his 02/09/2023 new patient appointment with Kara Dies, NP.

## 2023-02-09 ENCOUNTER — Ambulatory Visit: Payer: BC Managed Care – PPO | Admitting: Nurse Practitioner

## 2023-02-12 ENCOUNTER — Ambulatory Visit: Payer: Self-pay

## 2023-02-22 ENCOUNTER — Encounter: Payer: Self-pay | Admitting: Family Medicine

## 2023-02-22 ENCOUNTER — Ambulatory Visit: Payer: BC Managed Care – PPO | Admitting: Family Medicine

## 2023-02-22 VITALS — BP 134/84 | HR 64 | Ht 71.0 in | Wt 154.0 lb

## 2023-02-22 DIAGNOSIS — Z113 Encounter for screening for infections with a predominantly sexual mode of transmission: Secondary | ICD-10-CM

## 2023-02-22 DIAGNOSIS — Z23 Encounter for immunization: Secondary | ICD-10-CM

## 2023-02-22 DIAGNOSIS — Z1159 Encounter for screening for other viral diseases: Secondary | ICD-10-CM

## 2023-02-22 DIAGNOSIS — Z0000 Encounter for general adult medical examination without abnormal findings: Secondary | ICD-10-CM

## 2023-02-22 DIAGNOSIS — Z114 Encounter for screening for human immunodeficiency virus [HIV]: Secondary | ICD-10-CM

## 2023-02-22 LAB — COMPREHENSIVE METABOLIC PANEL
ALT: 15 U/L (ref 0–53)
AST: 15 U/L (ref 0–37)
Albumin: 4.6 g/dL (ref 3.5–5.2)
Alkaline Phosphatase: 52 U/L (ref 39–117)
BUN: 9 mg/dL (ref 6–23)
CO2: 27 meq/L (ref 19–32)
Calcium: 9.1 mg/dL (ref 8.4–10.5)
Chloride: 102 meq/L (ref 96–112)
Creatinine, Ser: 0.84 mg/dL (ref 0.40–1.50)
GFR: 118.21 mL/min (ref >60.00)
Glucose, Bld: 96 mg/dL (ref 70–99)
Potassium: 3.6 meq/L (ref 3.5–5.1)
Sodium: 139 meq/L (ref 135–145)
Total Bilirubin: 0.5 mg/dL (ref 0.2–1.2)
Total Protein: 6.9 g/dL (ref 6.0–8.3)

## 2023-02-22 LAB — LIPID PANEL
Cholesterol: 171 mg/dL (ref 0–200)
HDL: 35.9 mg/dL — ABNORMAL LOW (ref >39.00)
LDL Cholesterol: 119 mg/dL — ABNORMAL HIGH (ref 0–99)
NonHDL: 134.8
Total CHOL/HDL Ratio: 5
Triglycerides: 80 mg/dL (ref 0.0–149.0)
VLDL: 16 mg/dL (ref 0.0–40.0)

## 2023-02-22 LAB — CBC WITH DIFFERENTIAL/PLATELET
Basophils Absolute: 0 10*3/uL (ref 0.0–0.1)
Basophils Relative: 0.6 % (ref 0.0–3.0)
Eosinophils Absolute: 0.2 10*3/uL (ref 0.0–0.7)
Eosinophils Relative: 2.1 % (ref 0.0–5.0)
HCT: 41.5 % (ref 39.0–52.0)
Hemoglobin: 13.5 g/dL (ref 13.0–17.0)
Lymphocytes Relative: 30.8 % (ref 12.0–46.0)
Lymphs Abs: 2.4 10*3/uL (ref 0.7–4.0)
MCHC: 32.5 g/dL (ref 30.0–36.0)
MCV: 85.3 fL (ref 78.0–100.0)
Monocytes Absolute: 0.4 10*3/uL (ref 0.1–1.0)
Monocytes Relative: 5.2 % (ref 3.0–12.0)
Neutro Abs: 4.7 10*3/uL (ref 1.4–7.7)
Neutrophils Relative %: 61.3 % (ref 43.0–77.0)
Platelets: 268 10*3/uL (ref 150.0–400.0)
RBC: 4.87 Mil/uL (ref 4.22–5.81)
RDW: 13.6 % (ref 11.5–15.5)
WBC: 7.7 10*3/uL (ref 4.0–10.5)

## 2023-02-22 LAB — TSH: TSH: 1.28 u[IU]/mL (ref 0.35–5.50)

## 2023-02-22 NOTE — Patient Instructions (Signed)
 Thank you for choosing [facility] for your Primary Care needs. I am excited for the opportunity to partner with you to meet your health care goals. It was a pleasure meeting you today!  Information on diet, exercise, and health maintenance recommendations are listed below. This is information to help you be sure you are on track for optimal health and monitoring.   Please look over this and let us know if you have any questions or if you have completed any of the health maintenance outside of [facility] so that we can be sure your records are up to date.  ___________________________________________________________  MyChart:  For all urgent or time sensitive needs we ask that you please call the office to avoid delays. Our number is [phone]. MyChart is not constantly monitored and due to the large volume of messages a day, replies may take up to 72 business hours.  MyChart Policy: MyChart allows for you to see your visit notes, after visit summary, provider recommendations, lab and tests results, make an appointment, request refills, and contact your provider or the office for non-urgent questions or concerns. Providers are seeing patients during normal business hours and do not have built in time to review MyChart messages.  We ask that you allow a minimum of 3 business days for responses to [employer]. For this reason, please do not send urgent requests through MyChart. Please call the office at [phone]. New and ongoing conditions may require a visit. We have virtual and in-person visits available for your convenience.  Complex MyChart concerns may require a visit. Your provider may request you schedule a virtual or in-person visit to ensure we are providing the best care possible. MyChart messages sent after 11:00 AM on Friday may not be received by the provider until Monday morning.    Lab and Test Results: You will receive your lab and test  results on MyChart as soon as they are completed and results have been sent by the lab or testing facility. Due to this service, you will receive your results BEFORE your provider.  I review lab and test results each morning prior to seeing patients. Some results require collaboration with other providers to ensure you are receiving the most appropriate care. For this reason, we ask that you please allow a minimum of 3-5 business days from the time that ALL results have been received for your provider to receive and review lab and test results and contact you about these.  Most lab and test result comments from the provider will be sent through MyChart. Your provider may recommend changes to the plan of care, follow-up visits, repeat testing, ask questions, or request an office visit to discuss these results. You may reply directly to this message or call the office to provide information for the provider or set up an appointment. In some instances, you will be called with test results and recommendations. Please let us know if this is preferred and we will make note of this in your chart to provide this for you.    If you have not heard a response to your lab or test results in 5 business days from all results returning to MyChart, please call the office to let us know. We ask that you please avoid calling prior to this time unless there is an emergent concern. Due to high call volumes, this can delay the resulting process.  After Hours: For all non-emergency after hours needs, please  call the office at [phone] and select the option to reach the on-call  service. On-call services are shared between multiple [place] offices and therefore it will not be possible to speak directly with your provider. On-call providers may provide medical advice and recommendations, but are unable to provide refills for maintenance medications.  For all emergency or urgent medical needs after normal business hours, we  recommend that you seek care at the closest Urgent Care or Emergency Department to ensure appropriate treatment in a timely manner.  [facility] has a 24 hour emergency room located on the ground floor for your convenience.   Urgent Concerns During the Business Day Providers are seeing patients from 8AM to 5PM with a busy schedule and are most often not able to respond to non-urgent calls until the end of the day or the next business day. If you should have URGENT concerns during the day, please call and speak to the nurse or schedule a same day appointment so that we can address your concern without delay.   Thank you, again, for choosing me as your health care partner. I appreciate your trust and look forward to learning more about you!   Lollie Marrow Reola Calkins, DNP, FNP-C  ___________________________________________________________  Health Maintenance Recommendations Screening Testing Mammogram Every 1-2 years based on history and risk factors Starting at age 89 Pap Smear Ages 21-39 every 3 years Ages 67-65 every 5 years with HPV testing More frequent testing may be required based on results and history Colon Cancer Screening Every 1-10 years based on test performed, risk factors, and history Starting at age 108 Bone Density Screening Every 2-10 years based on history Starting at age 80 for women Recommendations for men differ based on medication usage, history, and risk factors AAA Screening One time ultrasound Men 52-68 years old who have ever smoked Lung Cancer Screening Low Dose Lung CT every 12 months Age 29-80 years with a 20 pack-year smoking history who still smoke or who have quit within the last 15 years  Screening Labs Routine  Labs: Complete Blood Count (CBC), Complete Metabolic Panel (CMP), Cholesterol (Lipid Panel) Every 6-12 months based on history and medications May be recommended more frequently based on current conditions or previous results Hemoglobin  A1c Lab Every 3-12 months based on history and previous results Starting at age 1 or earlier with diagnosis of diabetes, high cholesterol, BMI >26, and/or risk factors Frequent monitoring for patients with diabetes to ensure blood sugar control Thyroid Panel  Every 6 months based on history, symptoms, and risk factors May be repeated more often if on medication HIV One time testing for all patients 92 and older May be repeated more frequently for patients with increased risk factors or exposure Hepatitis C One time testing for all patients 19 and older May be repeated more frequently for patients with increased risk factors or exposure Gonorrhea, Chlamydia Every 12 months for all sexually active persons 13-24 years Additional monitoring may be recommended for those who are considered high risk or who have symptoms PSA Men 34-21 years old with risk factors Additional screening may be recommended from age 37-69 based on risk factors, symptoms, and history  Vaccine Recommendations Tetanus Booster All adults every 10 years Flu Vaccine All patients 6 months and older every year COVID Vaccine All patients 12 years and older Initial dosing with booster May recommend additional booster based on age and health history HPV Vaccine 2 doses all patients age 3-26 Dosing may be considered  for patients over 26 Shingles Vaccine (Shingrix) 2 doses all adults 50 years and older Pneumonia (Pneumovax 23) All adults 65 years and older May recommend earlier dosing based on health history Pneumonia (Prevnar 60) All adults 65 years and older Dosed 1 year after Pneumovax 23 Pneumonia (Prevnar 20) All adults 65 years and older (adults 19-64 with certain conditions or risk factors) 1 dose  For those who have not received Prevnar 13 vaccine previously   Additional Screening, Testing, and Vaccinations may be recommended on an individualized basis based on family history, health history, risk  factors, and/or exposure.  __________________________________________________________  Diet Recommendations for All Patients  I recommend that all patients maintain a diet low in saturated fats, carbohydrates, and cholesterol. While this can be challenging at first, it is not impossible and small changes can make big differences.  Things to try: Decreasing the amount of soda, sweet tea, and/or juice to one or less per day and replace with water While water is always the first choice, if you do not like water you may consider adding a water additive without sugar to improve the taste other sugar free drinks Replace potatoes with a brightly colored vegetable  Use healthy oils, such as canola oil or olive oil, instead of butter or hard margarine Limit your bread intake to two pieces or less a day Replace regular pasta with low carb pasta options Bake, broil, or grill foods instead of frying Monitor portion sizes  Eat smaller, more frequent meals throughout the day instead of large meals  An important thing to remember is, if you love foods that are not great for your health, you don't have to give them up completely. Instead, allow these foods to be a reward when you have done well. Allowing yourself to still have special treats every once in a while is a nice way to tell yourself thank you for working hard to keep yourself healthy.   Also remember that every day is a new day. If you have a bad day and "fall off the wagon", you can still climb right back up and keep moving along on your journey!  We have resources available to help you!  Some websites that may be helpful include: www.http://www.wall-[place].info/  Www.VeryWellFit.com _____________________________________________________________  Activity Recommendations for All Patients  I recommend that all adults get at least 30 minutes of moderate physical activity that elevates your heart rate at least 5 days out of the week.  Some examples  include: Walking or jogging at a pace that allows you to carry on a conversation Cycling (stationary bike or outdoors) Water aerobics Yoga Weight lifting Dancing If physical limitations prevent you from putting stress on your joints, exercise in a pool or seated in a chair are excellent options.  Do determine your MAXIMUM heart rate for activity: 220 - YOUR AGE = MAX Heart Rate   Remember! Do not push yourself too hard.  Start slowly and build up your pace, speed, weight, time in exercise, etc.  Allow your body to rest between exercise and get good sleep. You will need more water than normal when you are exerting yourself. Do not wait until you are thirsty to drink. Drink with a purpose of getting in at least 8, 8 ounce glasses of water a day plus more depending on how much you exercise and sweat.    If you begin to develop dizziness, chest pain, abdominal pain, jaw pain, shortness of breath, headache, vision changes, lightheadedness, or other concerning symptoms,  stop the activity and allow your body to rest. If your symptoms are severe, seek emergency evaluation immediately. If your symptoms are concerning, but not severe, please let us know so that we can recommend further evaluation.

## 2023-02-22 NOTE — Progress Notes (Signed)
Complete physical exam, New Patienti  Patient: Jeremiah Mccarthy   DOB: [birth year]   27 y.o. Male  MRN: [id number]  Subjective:    Chief Complaint  Patient presents with   Establish Care    Jeremiah Mccarthy is a 28 y.o. male who presents today to establish care and for a complete physical exam. He reports consuming a general diet. The patient has a physically strenuous job, but has no regular exercise apart from work.  He generally feels well. He reports sleeping well. He does not have additional problems to discuss today.   Currently lives with: family Acute concerns or interim problems since last visit: no  Vision concerns: no concerns Dental concerns: no concerns  STD concerns: no concerns, would like routine screening  ETOH NWG:NFAO Nicotine use: vape Recreational drugs/illegal substances: no       Most recent fall risk assessment:    02/22/2023    9:40 AM  Fall Risk   Falls in the past year? 0  Number falls in past yr: 0  Injury with Fall? 0  Risk for fall due to : No Fall Risks  Follow up Falls evaluation completed     Most recent depression screenings:    02/22/2023    9:40 AM  PHQ 2/9 Scores  PHQ - 2 Score 0  PHQ- 9 Score 0         Patient Care Team: Clayborne Dana, NP as PCP - General (Family Medicine)   Outpatient Medications Prior to Visit  Medication Sig   [DISCONTINUED] docusate sodium (COLACE) 100 MG capsule Take 1 tablet once or twice daily as needed for constipation while taking narcotic pain medicine (Patient taking differently: Take 100 mg by mouth daily as needed for mild constipation.)   [DISCONTINUED] oxyCODONE-acetaminophen (PERCOCET) 5-325 MG tablet Take 2 tablets by mouth every 6 (six) hours as needed for severe pain.   No facility-administered medications prior to visit.    ROS All review of systems negative except what is listed in the HPI        Objective:     BP 134/84   Pulse 64   Ht 5\' 11"  (1.803 m)   Wt  154 lb (69.9 kg)   SpO2 99%   BMI 21.48 kg/m    Physical Exam Vitals reviewed.  Constitutional:      General: He is not in acute distress.    Appearance: Normal appearance. He is not ill-appearing.  HENT:     Head: Normocephalic and atraumatic.     Right Ear: Tympanic membrane normal.     Left Ear: Tympanic membrane normal.     Nose: Nose normal.     Mouth/Throat:     Mouth: Mucous membranes are moist.     Pharynx: Oropharynx is clear.  Eyes:     Extraocular Movements: Extraocular movements intact.     Conjunctiva/sclera: Conjunctivae normal.     Pupils: Pupils are equal, round, and reactive to light.  Cardiovascular:     Rate and Rhythm: Normal rate and regular rhythm.     Pulses: Normal pulses.     Heart sounds: Normal heart sounds.  Pulmonary:     Effort: Pulmonary effort is normal.     Breath sounds: Normal breath sounds.  Abdominal:     General: Abdomen is flat. Bowel sounds are normal. There is no distension.     Palpations: Abdomen is soft. There is no mass.     Tenderness: There is no abdominal  tenderness. There is no right CVA tenderness, left CVA tenderness, guarding or rebound.  Genitourinary:    Comments: Deferred exam Musculoskeletal:        General: Normal range of motion.     Cervical back: Normal range of motion and neck supple. No tenderness.     Right lower leg: No edema.     Left lower leg: No edema.  Lymphadenopathy:     Cervical: No cervical adenopathy.  Skin:    General: Skin is warm and dry.     Capillary Refill: Capillary refill takes less than 2 seconds.  Neurological:     General: No focal deficit present.     Mental Status: He is alert and oriented to person, place, and time. Mental status is at baseline.  Psychiatric:        Mood and Affect: Mood normal.        Behavior: Behavior normal.        Thought Content: Thought content normal.        Judgment: Judgment normal.          No results found for any visits on 02/22/23.      Assessment & Plan:    Routine Health Maintenance and Physical Exam Discussed health promotion and safety including diet and exercise recommendations, dental health, and injury prevention. Tobacco cessation if applicable. Seat belts, sunscreen, smoke detectors, etc.    Immunization History  Administered Date(s) Administered   DTaP 03/23/1994, 05/23/1994, 07/27/1994, 06/18/1995   HIB (PRP-OMP) 03/23/1994, 05/23/1994, 07/27/1994, 06/18/1995   Hepatitis B 05/23/1994, 07/27/1994, 08/25/1995   IPV 03/23/1994, 05/23/1994, 07/27/1994, 10/14/1999   MMR 06/18/1995, 10/14/1999   Meningococcal polysaccharide vaccine (MPSV4) 07/11/2012   Td 10/14/1999   Tdap 07/11/2012, 02/22/2023    Health Maintenance  Topic Date Due   HIV Screening  Never done   Hepatitis C Screening  Never done   COVID-19 Vaccine (1 - 2024-25 season) 02/23/2023 (Originally 10/01/2022)   INFLUENZA VACCINE  04/30/2023 (Originally 08/31/2022)   Pneumococcal Vaccine 14-82 Years old (1 of 2 - PCV) 02/22/2024 (Originally 01/26/2000)   DTaP/Tdap/Td (8 - Td or Tdap) 02/21/2033   HPV VACCINES  Aged Out      Problem List Items Addressed This Visit   None Visit Diagnoses       Annual physical exam    -  Primary   Relevant Orders   CBC with Differential/Platelet   Comprehensive metabolic panel   Lipid panel   TSH   Hepatitis C antibody   RPR   HSV(herpes simplex vrs) 1+2 ab-IgG   C. trachomatis/N. gonorrhoeae RNA   HIV Antibody (routine testing w rflx)     Encounter for medical examination to establish care         Screen for STD (sexually transmitted disease)       Relevant Orders   Hepatitis C antibody   RPR   HSV(herpes simplex vrs) 1+2 ab-IgG   C. trachomatis/N. gonorrhoeae RNA   HIV Antibody (routine testing w rflx)     Encounter for screening for HIV       Relevant Orders   HIV Antibody (routine testing w rflx)     Encounter for hepatitis C screening test for low risk patient       Relevant Orders   Hepatitis  C antibody     Need for Tdap vaccination       Relevant Orders   Tdap vaccine greater than or equal to 7yo IM (Completed)  Return in about 1 year (around 02/22/2024) for physical.     Clayborne Dana, NP

## 2023-02-24 LAB — HSV(HERPES SIMPLEX VRS) I + II AB-IGG
HSV 1 IGG,TYPE SPECIFIC AB: <0.9 {index}
HSV 2 IGG,TYPE SPECIFIC AB: <0.9 {index}

## 2023-02-24 LAB — C. TRACHOMATIS/N. GONORRHOEAE RNA
C. trachomatis RNA, TMA: NOT DETECTED
N. gonorrhoeae RNA, TMA: NOT DETECTED

## 2023-02-24 LAB — HIV ANTIBODY (ROUTINE TESTING W REFLEX): HIV 1&2 Ab, 4th Generation: NONREACTIVE

## 2023-02-24 LAB — RPR: RPR Ser Ql: NONREACTIVE

## 2023-02-24 LAB — HEPATITIS C ANTIBODY: Hepatitis C Ab: NONREACTIVE

## 2023-02-25 ENCOUNTER — Encounter: Payer: Self-pay | Admitting: Family Medicine
